# Patient Record
Sex: Female | Born: 1950 | Race: White | Hispanic: No | Marital: Married | Smoking: Never smoker
Health system: Southern US, Community
[De-identification: ages and names within clinical notes are randomized; demographics above are authoritative.]

## PROBLEM LIST (undated history)

## (undated) DIAGNOSIS — R002 Palpitations: Secondary | ICD-10-CM

## (undated) HISTORY — PX: TUBAL LIGATION: SHX77

## (undated) HISTORY — DX: Palpitations: R00.2

---

## 2003-08-28 ENCOUNTER — Emergency Department (HOSPITAL_COMMUNITY): Admission: EM | Admit: 2003-08-28 | Discharge: 2003-08-28 | Payer: Self-pay | Admitting: Family Medicine

## 2008-08-24 DEATH — deceased

## 2012-10-07 ENCOUNTER — Telehealth: Payer: Self-pay | Admitting: Pulmonary Disease

## 2012-10-07 NOTE — Telephone Encounter (Signed)
Entered by error

## 2013-12-23 ENCOUNTER — Other Ambulatory Visit: Payer: Self-pay

## 2013-12-23 DIAGNOSIS — Z1231 Encounter for screening mammogram for malignant neoplasm of breast: Secondary | ICD-10-CM

## 2014-01-14 ENCOUNTER — Ambulatory Visit: Payer: Self-pay

## 2014-01-15 ENCOUNTER — Ambulatory Visit
Admission: RE | Admit: 2014-01-15 | Discharge: 2014-01-15 | Disposition: A | Discharge status: Home / Self Care | Payer: No Typology Code available for payment source | Source: Ambulatory Visit

## 2014-01-15 DIAGNOSIS — Z1231 Encounter for screening mammogram for malignant neoplasm of breast: Secondary | ICD-10-CM

## 2014-01-21 ENCOUNTER — Other Ambulatory Visit: Payer: Self-pay | Admitting: *Deleted

## 2014-01-21 DIAGNOSIS — R928 Other abnormal and inconclusive findings on diagnostic imaging of breast: Secondary | ICD-10-CM

## 2014-02-04 ENCOUNTER — Ambulatory Visit
Admission: RE | Admit: 2014-02-04 | Discharge: 2014-02-04 | Disposition: A | Discharge status: Home / Self Care | Payer: No Typology Code available for payment source | Source: Ambulatory Visit | Attending: *Deleted | Admitting: *Deleted

## 2014-02-04 DIAGNOSIS — R928 Other abnormal and inconclusive findings on diagnostic imaging of breast: Secondary | ICD-10-CM

## 2014-02-11 ENCOUNTER — Other Ambulatory Visit: Payer: No Typology Code available for payment source

## 2015-03-30 ENCOUNTER — Ambulatory Visit: Payer: Self-pay | Admitting: Surgery

## 2015-03-30 NOTE — H&P (Signed)
History of Present Illness Wilmon Arms(Aujanae Mccullum K. Dacari Beckstrand MD; 03/30/2015 10:17 AM) Patient words: cyst back.  The patient is a 65 year old female who presents with a complaint of Mass. This is a 65 yo female who presents with a history of a previous sebaceous cyst on her back that was drained by a family practice physician a couple of years ago. The wound healed and the cyst has recurred. Recently became larger and more inflamed. She has been on a course of doxycycline and this seems to be improving. It still is fairly tender. She would like to have it removed.   Past Surgical History Gilmer Mor(Sonya Bynum, CMA; 03/30/2015 9:48 AM) Cesarean Section - 1  Diagnostic Studies History Gilmer Mor(Sonya Bynum, CMA; 03/30/2015 9:48 AM) Colonoscopy within last year Mammogram 1-3 years ago Pap Smear 1-5 years ago  Allergies Gilmer Mor(Sonya Bynum, CMA; 03/30/2015 9:49 AM) No Known Drug Allergies03/06/2015  Medication History (Sonya Bynum, CMA; 03/30/2015 9:49 AM) Doxycycline Hyclate (100MG  Tablet, Oral) Active. Medications Reconciled  Social History Gilmer Mor(Sonya Bynum, CMA; 03/30/2015 9:48 AM) Alcohol use Occasional alcohol use. Caffeine use Coffee, Tea. No drug use Tobacco use Never smoker.  Family History Gilmer Mor(Sonya Bynum, CMA; 03/30/2015 9:48 AM) Heart Disease Father. Hypertension Mother. Melanoma Mother. Prostate Cancer Father. Respiratory Condition Mother.  Pregnancy / Birth History Gilmer Mor(Sonya Bynum, CMA; 03/30/2015 9:48 AM) Age at menarche 15 years. Age of menopause 646-50 Gravida 5 Maternal age 63-25 Para 5    Review of Systems Lamar Laundry(Sonya Bynum CMA; 03/30/2015 9:48 AM) General Not Present- Appetite Loss, Chills, Fatigue, Fever, Night Sweats, Weight Gain and Weight Loss. Skin Not Present- Change in Wart/Mole, Dryness, Hives, Jaundice, New Lesions, Non-Healing Wounds, Rash and Ulcer. HEENT Not Present- Earache, Hearing Loss, Hoarseness, Nose Bleed, Oral Ulcers, Ringing in the Ears, Seasonal Allergies, Sinus Pain, Sore Throat,  Visual Disturbances, Wears glasses/contact lenses and Yellow Eyes. Respiratory Not Present- Bloody sputum, Chronic Cough, Difficulty Breathing, Snoring and Wheezing. Breast Not Present- Breast Mass, Breast Pain, Nipple Discharge and Skin Changes. Cardiovascular Not Present- Chest Pain, Difficulty Breathing Lying Down, Leg Cramps, Palpitations, Rapid Heart Rate, Shortness of Breath and Swelling of Extremities. Gastrointestinal Not Present- Abdominal Pain, Bloating, Bloody Stool, Change in Bowel Habits, Chronic diarrhea, Constipation, Difficulty Swallowing, Excessive gas, Gets full quickly at meals, Hemorrhoids, Indigestion, Nausea, Rectal Pain and Vomiting. Female Genitourinary Not Present- Frequency, Nocturia, Painful Urination, Pelvic Pain and Urgency. Musculoskeletal Not Present- Back Pain, Joint Pain, Joint Stiffness, Muscle Pain, Muscle Weakness and Swelling of Extremities. Neurological Not Present- Decreased Memory, Fainting, Headaches, Numbness, Seizures, Tingling, Tremor, Trouble walking and Weakness. Psychiatric Not Present- Anxiety, Bipolar, Change in Sleep Pattern, Depression, Fearful and Frequent crying. Endocrine Not Present- Cold Intolerance, Excessive Hunger, Hair Changes, Heat Intolerance, Hot flashes and New Diabetes. Hematology Not Present- Easy Bruising, Excessive bleeding, Gland problems, HIV and Persistent Infections.  Vitals (Sonya Bynum CMA; 03/30/2015 9:48 AM) 03/30/2015 9:48 AM Weight: 103 lb Height: 65in Body Surface Area: 1.49 m Body Mass Index: 17.14 kg/m  Temp.: 16F(Temporal)  Pulse: 75 (Regular)  BP: 122/70 (Sitting, Left Arm, Standard)       Physical Exam Molli Hazard(Jahmeek Shirk K. Keali Mccraw MD; 03/30/2015 10:18 AM) The physical exam findings are as follows: Note:WDWN in NAD Back - oval-shaped 3 cm soft subcutaneous mass with some overlying discoloration; no spreading erythema or drainage noted; minimally tender    Assessment & Plan Molli Hazard(Suhaas Agena K. Radonna Bracher MD; 03/30/2015  10:01 AM) RUPTURED SEBACEOUS CYST (L72.0) Impression: Back - 3 cm Current Plans Schedule for Surgery - Excision of sebaceous cyst -  back. The surgical procedure has been discussed with the patient. Potential risks, benefits, alternative treatments, and expected outcomes have been explained. All of the patient's questions at this time have been answered. The likelihood of reaching the patient's treatment goal is good. The patient understand the proposed surgical procedure and wishes to proceed.   Wilmon Arms. Corliss Skains, MD, [facility]  General/ Trauma Surgery  03/30/2015 10:19 AM

## 2016-06-24 DIAGNOSIS — M159 Polyosteoarthritis, unspecified: Secondary | ICD-10-CM | POA: Insufficient documentation

## 2016-08-01 ENCOUNTER — Other Ambulatory Visit: Payer: Self-pay | Admitting: Physician Assistant

## 2016-08-01 DIAGNOSIS — E559 Vitamin D deficiency, unspecified: Secondary | ICD-10-CM

## 2016-08-01 DIAGNOSIS — Z1231 Encounter for screening mammogram for malignant neoplasm of breast: Secondary | ICD-10-CM

## 2016-08-22 ENCOUNTER — Other Ambulatory Visit: Payer: Self-pay | Admitting: Physician Assistant

## 2016-08-22 DIAGNOSIS — M2550 Pain in unspecified joint: Secondary | ICD-10-CM

## 2016-08-23 ENCOUNTER — Ambulatory Visit: Payer: No Typology Code available for payment source

## 2016-08-23 ENCOUNTER — Other Ambulatory Visit: Payer: No Typology Code available for payment source

## 2016-08-23 ENCOUNTER — Encounter (INDEPENDENT_AMBULATORY_CARE_PROVIDER_SITE_OTHER): Payer: Self-pay

## 2016-08-23 ENCOUNTER — Ambulatory Visit
Admission: RE | Admit: 2016-08-23 | Discharge: 2016-08-23 | Disposition: A | Discharge status: Home / Self Care | Payer: BLUE CROSS/BLUE SHIELD | Source: Ambulatory Visit | Attending: Physician Assistant | Admitting: Physician Assistant

## 2016-08-23 DIAGNOSIS — M2550 Pain in unspecified joint: Secondary | ICD-10-CM

## 2017-12-19 ENCOUNTER — Ambulatory Visit: Payer: BLUE CROSS/BLUE SHIELD | Admitting: Certified Nurse Midwife

## 2017-12-19 ENCOUNTER — Other Ambulatory Visit: Payer: Self-pay | Admitting: *Deleted

## 2017-12-19 ENCOUNTER — Other Ambulatory Visit: Payer: Self-pay

## 2017-12-19 ENCOUNTER — Other Ambulatory Visit (HOSPITAL_COMMUNITY)
Admission: RE | Admit: 2017-12-19 | Discharge: 2017-12-19 | Disposition: A | Discharge status: Home / Self Care | Payer: BLUE CROSS/BLUE SHIELD | Source: Ambulatory Visit | Attending: Obstetrics & Gynecology | Admitting: Obstetrics & Gynecology

## 2017-12-19 ENCOUNTER — Encounter: Payer: Self-pay | Admitting: Certified Nurse Midwife

## 2017-12-19 VITALS — BP 130/80 | HR 68 | Resp 16 | Ht 64.5 in | Wt 102.0 lb

## 2017-12-19 DIAGNOSIS — Z8739 Personal history of other diseases of the musculoskeletal system and connective tissue: Secondary | ICD-10-CM

## 2017-12-19 DIAGNOSIS — J0100 Acute maxillary sinusitis, unspecified: Secondary | ICD-10-CM | POA: Insufficient documentation

## 2017-12-19 DIAGNOSIS — R636 Underweight: Secondary | ICD-10-CM

## 2017-12-19 DIAGNOSIS — Z124 Encounter for screening for malignant neoplasm of cervix: Secondary | ICD-10-CM | POA: Insufficient documentation

## 2017-12-19 DIAGNOSIS — Z01411 Encounter for gynecological examination (general) (routine) with abnormal findings: Secondary | ICD-10-CM | POA: Diagnosis not present

## 2017-12-19 DIAGNOSIS — J209 Acute bronchitis, unspecified: Secondary | ICD-10-CM | POA: Insufficient documentation

## 2017-12-19 DIAGNOSIS — E041 Nontoxic single thyroid nodule: Secondary | ICD-10-CM

## 2017-12-19 DIAGNOSIS — N952 Postmenopausal atrophic vaginitis: Secondary | ICD-10-CM

## 2017-12-19 DIAGNOSIS — R509 Fever, unspecified: Secondary | ICD-10-CM | POA: Insufficient documentation

## 2017-12-19 NOTE — Progress Notes (Signed)
67 y.o. G5P0 Married  Caucasian Fe here to establish gyn care and  for annual exam. Menopausal since late 4440's. Denies vaginal bleeding. Does have severe vaginal dryness, has not treated with anything except some OTC progesterone cream in past. Sexually active with no concerns with pain. Patient has osteoarthritis of joints only. She would like to start on HRT if possible for joint pain. She had discussed with her PCP and told this is not a good idea. Here to discuss along with gyn exam. Patient is thin and has decreased in weight in past 2-3 years and she feels this has not helped. She does exercise and takes OTC calcium and multivitamin. Still feels tired.Had recent lab panel drawn with PCP and will review with her aex later this week. She takes Cymbalta for joint pain which does help. No other health issues today.  No LMP recorded (exact date). Patient is postmenopausal.          Sexually active: Yes.    The current method of family planning is tubal ligation.    Exercising: Yes.    bicycle-pullups Smoker:  no  Review of Systems  Constitutional: Negative.   HENT: Negative.   Eyes: Negative.   Respiratory: Negative.   Cardiovascular: Negative.   Gastrointestinal: Negative.   Genitourinary: Negative.   Musculoskeletal:       Arthritis  Skin: Negative.   Neurological: Negative.   Endo/Heme/Allergies: Negative.   Psychiatric/Behavioral: Negative.     Health Maintenance: Pap:  8772yrs ago-neg per patient  History of Abnormal Pap: no MMG:  1572yrs ago Self Breast exams: no Colonoscopy:  2 or 4572yrs ago neg per patient BMD:   2018 osteopenia, TDaP:  Not sure unsure Shingles: had done Pneumonia: not done Hep C and HIV: not done [facility]   reports that she has never smoked. She has never used smokeless tobacco. She reports that she drank alcohol. She reports that she does not use drugs.  History reviewed. No pertinent past medical history.  Past Surgical History:  Procedure  Laterality Date  . CESAREAN SECTION    . TUBAL LIGATION      Current Outpatient Medications  Medication Sig Dispense Refill  . DULoxetine (CYMBALTA) 30 MG capsule     . Multiple Vitamins-Minerals (MULTIVITAMIN PO) Take by mouth.    Marland Kitchen. UNABLE TO FIND CBD oil     No current facility-administered medications for this visit.     History reviewed. No pertinent family history.  ROS:  Pertinent items are noted in HPI.  Otherwise, a comprehensive ROS was negative.  Exam:   BP 130/80   Pulse 68   Resp 16   Ht 5' 4.5" (1.638 m)   Wt 102 lb (46.3 kg)   LMP  (Exact Date)   BMI 17.24 kg/m  Height: 5' 4.5" (163.8 cm) Ht Readings from Last 3 Encounters:  12/19/17 5' 4.5" (1.638 m)    General appearance: alert, cooperative and appears stated age Head: Normocephalic, without obvious abnormality, atraumatic Neck: no adenopathy, supple, symmetrical, trachea midline and thyroid normal to inspection and palpation and nodule noted on right lower lobe, non tender Lungs: clear to auscultation bilaterally Breasts: normal appearance, no masses or tenderness, No nipple retraction or dimpling, No nipple discharge or bleeding, No axillary or supraclavicular adenopathy, small Heart: regular rate and rhythm Abdomen: soft, non-tender; no masses,  no organomegaly Extremities: extremities normal, atraumatic, no cyanosis or edema Skin: Skin color, texture, turgor normal. No rashes or lesions Lymph nodes: Cervical,  supraclavicular, and axillary nodes normal. No abnormal inguinal nodes palpated Neurologic: Grossly normal   Pelvic: External genitalia:  no lesions              Urethra:  normal appearing urethra with no masses, tenderness or lesions              Bartholin's and Skene's: normal                 Vagina: normal appearing vagina with normal color and discharge, no lesions              Cervix: no cervical motion tenderness and no lesions              Pap taken: Yes.   Bimanual Exam:  Uterus:   normal size, contour, position, consistency, mobility, non-tender and anteverted              Adnexa: normal adnexa and no mass, fullness, tenderness               Rectovaginal: Confirms               Anus:  normal sphincter tone, no lesions  Chaperone present: yes  A:  Well Woman with normal exam  Post menopausal  Osteopenia/osteoarthritis of joints with PCP management with Cymbalta and calcium  Atrophic vaginitis progesterone cream working well (OTC)  Weight loss  Thyroid nodule  Mammogram overdue  P:   Reviewed health and wellness pertinent to exam  Discussed risks/benefits of starting HRT at 66 and concerns with increase uterine, breast cancer risk and may not benefit joint issue. Patient does not want to add another concern. Discussed working with PCP for adequate  Nutrition/calcium/vitamin d and possible water exercise to help with flexability. Discussed using calcium with algae base for better absorption and where she may obtain this, needs to read information prior to starting. Continue follow up with PCP as indicated.  Discussed small frequent meals work better than large ones for weight maintenance. She will try.  Discussed thyroid nodule finding and need to evaluate with [place]. Patient will be called with scheduling information.  Given information to schedule mammogram soon.  Pap smear: yes   counseled on breast self exam, mammography screening, feminine hygiene, osteoporosis, adequate intake of calcium and vitamin D, diet and exercise  return annually or prn  An After Visit Summary was printed and given to the patient.

## 2017-12-19 NOTE — Patient Instructions (Signed)

## 2017-12-25 ENCOUNTER — Ambulatory Visit
Admission: RE | Admit: 2017-12-25 | Discharge: 2017-12-25 | Disposition: A | Discharge status: Home / Self Care | Payer: BLUE CROSS/BLUE SHIELD | Source: Ambulatory Visit | Attending: Certified Nurse Midwife | Admitting: Certified Nurse Midwife

## 2017-12-25 DIAGNOSIS — E041 Nontoxic single thyroid nodule: Secondary | ICD-10-CM

## 2017-12-25 LAB — CYTOLOGY - PAP
DIAGNOSIS: NEGATIVE
HPV (WINDOPATH): NOT DETECTED

## 2017-12-27 ENCOUNTER — Other Ambulatory Visit: Payer: Self-pay | Admitting: Certified Nurse Midwife

## 2018-01-03 ENCOUNTER — Other Ambulatory Visit: Payer: Self-pay | Admitting: Certified Nurse Midwife

## 2018-01-03 ENCOUNTER — Ambulatory Visit
Admission: RE | Admit: 2018-01-03 | Discharge: 2018-01-03 | Disposition: A | Discharge status: Home / Self Care | Payer: BLUE CROSS/BLUE SHIELD | Source: Ambulatory Visit | Attending: Certified Nurse Midwife | Admitting: Certified Nurse Midwife

## 2018-01-03 DIAGNOSIS — Z1231 Encounter for screening mammogram for malignant neoplasm of breast: Secondary | ICD-10-CM

## 2019-02-12 ENCOUNTER — Other Ambulatory Visit: Payer: Self-pay

## 2019-02-15 ENCOUNTER — Ambulatory Visit: Payer: Self-pay | Admitting: Certified Nurse Midwife

## 2019-02-28 ENCOUNTER — Telehealth: Payer: Self-pay

## 2019-02-28 NOTE — Telephone Encounter (Signed)
NOTES ON FILE FROM [facility] [phone] SENT REFERRAL TO SCHEDULING 

## 2019-03-11 ENCOUNTER — Other Ambulatory Visit: Payer: Self-pay

## 2019-03-11 ENCOUNTER — Telehealth: Payer: Self-pay | Admitting: *Deleted

## 2019-03-11 ENCOUNTER — Ambulatory Visit: Payer: Medicare HMO | Admitting: Interventional Cardiology

## 2019-03-11 ENCOUNTER — Encounter: Payer: Self-pay | Admitting: Interventional Cardiology

## 2019-03-11 VITALS — BP 112/68 | HR 72 | Ht 64.5 in | Wt 105.1 lb

## 2019-03-11 DIAGNOSIS — Z7189 Other specified counseling: Secondary | ICD-10-CM

## 2019-03-11 DIAGNOSIS — R9431 Abnormal electrocardiogram [ECG] [EKG]: Secondary | ICD-10-CM

## 2019-03-11 DIAGNOSIS — R002 Palpitations: Secondary | ICD-10-CM

## 2019-03-11 NOTE — Telephone Encounter (Signed)
Patient enrolled for [employer] to ship a 30 day cardiac event monitor to her home.  My Chart message sent with instructions.

## 2019-03-11 NOTE — Patient Instructions (Signed)
Medication Instructions:  Your physician recommends that you continue on your current medications as directed. Please refer to the Current Medication list given to you today.  *If you need a refill on your cardiac medications before your next appointment, please call your pharmacy*  Lab Work: None If you have labs (blood work) drawn today and your tests are completely normal, you will receive your results only by: Marland Kitchen MyChart Message (if you have MyChart) OR . A paper copy in the mail If you have any lab test that is abnormal or we need to change your treatment, we will call you to review the results.  Testing/Procedures: Your physician has recommended that you wear an event monitor. Event monitors are medical devices that record the heart's electrical activity. Doctors most often [place] these monitors to diagnose arrhythmias. Arrhythmias are problems with the speed or rhythm of the heartbeat. The monitor is a small, portable device. You can wear one while you do your normal daily activities. This is usually used to diagnose what is causing palpitations/syncope (passing out).   Follow-Up: At [facility], you and your health needs are our priority.  As part of our continuing mission to provide you with exceptional heart care, we have created designated Provider Care Teams.  These Care Teams include your primary Cardiologist (physician) and Advanced Practice Providers (APPs -  Physician Assistants and Nurse Practitioners) who all work together to provide you with the care you need, when you need it.  Your next appointment:   As needed  The format for your next appointment:   In Person  Provider:   You may see Dr. Verdis Prime or one of the following Advanced Practice Providers on your designated Care Team:    Norma Fredrickson, NP  Nada Boozer, NP  Georgie Chard, NP   Other Instructions

## 2019-03-11 NOTE — Progress Notes (Signed)
Cardiology Office Note:    Date:  03/11/2019   ID:  Kristie Salas, DOB 1950/07/08, MRN [id number]  PCP:  Buzzy Han, MD  Cardiologist:  No primary care provider on file.   Referring MD: Buzzy Han*   Chief Complaint  Patient presents with  . Irregular Heart Beat    History of Present Illness:    Kristie Salas is a 69 y.o. female with a hx of palpitations referred for clinical evaluation.  For more than 20 years she has had irregularity in heartbeat.  These palpitations are most notable at night.  This happens when she lies down to try to sleep.  She also has episodes where the heart will race for minutes and cause her to feel tight in her chest and weak.  She has been having increased frequency of these episodes over the past several weeks.  She denies sympathomimetics and alcohol.  She denies significant caffeine intake.  She does not smoke.  Past Medical History:  Diagnosis Date  . Palpitations     Past Surgical History:  Procedure Laterality Date  . CESAREAN SECTION    . TUBAL LIGATION      Current Medications: Current Meds  Medication Sig  . Calcium Carbonate Antacid (CALCIUM CARBONATE PO) Take by mouth.  . DULoxetine (CYMBALTA) 30 MG capsule   . Fluticasone Furoate 50 MCG/ACT AEPB Inhale into the lungs.  . Multiple Vitamins-Minerals (MULTIVITAMIN PO) Take by mouth.  . Turmeric (QC TUMERIC COMPLEX PO) Take by mouth.  Marland Kitchen UNABLE TO FIND CBD oil     Allergies:   Patient has no known allergies.   Social History   Socioeconomic History  . Marital status: Married    Spouse name: Not on file  . Number of children: Not on file  . Years of education: Not on file  . Highest education level: Not on file  Occupational History  . Not on file  Tobacco Use  . Smoking status: Never Smoker  . Smokeless tobacco: Never Used  Substance and Sexual Activity  . Alcohol use: Not Currently  . Drug use: Never  . Sexual activity: Yes    Partners:  Male    Birth control/protection: Surgical    Comment: BTL  Other Topics Concern  . Not on file  Social History Narrative  . Not on file   Social Determinants of Health   Financial Resource Strain:   . Difficulty of Paying Living Expenses: Not on file  Food Insecurity:   . Worried About Charity fundraiser in the Last Year: Not on file  . Ran Out of Food in the Last Year: Not on file  Transportation Needs:   . Lack of Transportation (Medical): Not on file  . Lack of Transportation (Non-Medical): Not on file  Physical Activity:   . Days of Exercise per Week: Not on file  . Minutes of Exercise per Session: Not on file  Stress:   . Feeling of Stress : Not on file  Social Connections:   . Frequency of Communication with Friends and Family: Not on file  . Frequency of Social Gatherings with Friends and Family: Not on file  . Attends Religious Services: Not on file  . Active Member of Clubs or Organizations: Not on file  . Attends Archivist Meetings: Not on file  . Marital Status: Not on file     Family History: The patient's family history includes Hypertension in her father and mother; Other in her father;  Prostate cancer in her father.  ROS:   Please see the history of present illness.    No prior history of heart disease.  Prior cardiac work-up.  A work-up was done because of possible heart attack at prior remote encounter but no abnormalities were found.  All other systems reviewed and are negative.  EKGs/Labs/Other Studies Reviewed:    The following studies were reviewed today: No cardiac data  EKG:  EKG sinus rhythm with nonspecific T wave flattening and ST depression II, III, and aVF.  Recent Labs: No results found for requested labs within last 8760 hours.  Recent Lipid Panel No results found for: CHOL, TRIG, HDL, CHOLHDL, VLDL, LDLCALC, LDLDIRECT  Physical Exam:    VS:  BP 112/68   Pulse 72   Ht 5' 4.5" (1.638 m)   Wt 105 lb 1.9 oz (47.7 kg)    BMI 17.77 kg/m     Wt Readings from Last 3 Encounters:  03/11/19 105 lb 1.9 oz (47.7 kg)  12/19/17 102 lb (46.3 kg)     GEN: Slender. No acute distress HEENT: Normal NECK: No JVD. LYMPHATICS: No lymphadenopathy CARDIAC:  RRR without murmur, gallop, or edema. VASCULAR:  Normal Pulses. No bruits. RESPIRATORY:  Clear to auscultation without rales, wheezing or rhonchi  ABDOMEN: Soft, non-tender, non-distended, No pulsatile mass, MUSCULOSKELETAL: No deformity  SKIN: Warm and dry NEUROLOGIC:  Alert and oriented x 3 PSYCHIATRIC:  Normal affect   ASSESSMENT:    1. Palpitations   2. Nonspecific abnormal electrocardiogram (ECG) (EKG)   3. Educated about COVID-19 virus infection    PLAN:    In order of problems listed above:  1. Rule out atrial fibrillation.  Rule out other.  We will plan a 30-day continuous monitor to rule out malignant arrhythmias, atrial fibrillation, versus more benign findings. 2. Abnormal EKG with nonspecific inferior ST depression.  If PVCs are noted on monitor, we will need to do an ischemic evaluation. 3. COVID-19 preventive measures including 3W's and COVID-19 vaccine.   Medication Adjustments/Labs and Tests Ordered: Current medicines are reviewed at length with the patient today.  Concerns regarding medicines are outlined above.  Orders Placed This Encounter  Procedures  . Cardiac event monitor  . EKG 12-Lead   No orders of the defined types were placed in this encounter.   Patient Instructions  Medication Instructions:  Your physician recommends that you continue on your current medications as directed. Please refer to the Current Medication list given to you today.  *If you need a refill on your cardiac medications before your next appointment, please call your pharmacy*  Lab Work: None If you have labs (blood work) drawn today and your tests are completely normal, you will receive your results only by: Marland Kitchen MyChart Message (if you have MyChart)  OR . A paper copy in the mail If you have any lab test that is abnormal or we need to change your treatment, we will call you to review the results.  Testing/Procedures: Your physician has recommended that you wear an event monitor. Event monitors are medical devices that record the heart's electrical activity. Doctors most often [place] these monitors to diagnose arrhythmias. Arrhythmias are problems with the speed or rhythm of the heartbeat. The monitor is a small, portable device. You can wear one while you do your normal daily activities. This is usually used to diagnose what is causing palpitations/syncope (passing out).   Follow-Up: At [facility], you and your health needs are our priority.  As  part of our continuing mission to provide you with exceptional heart care, we have created designated Provider Care Teams.  These Care Teams include your primary Cardiologist (physician) and Advanced Practice Providers (APPs -  Physician Assistants and Nurse Practitioners) who all work together to provide you with the care you need, when you need it.  Your next appointment:   As needed  The format for your next appointment:   In Person  Provider:   You may see Dr. Verdis Prime or one of the following Advanced Practice Providers on your designated Care Team:    Norma Fredrickson, NP  Nada Boozer, NP  Georgie Chard, NP   Other Instructions      Signed, Lesleigh Noe, MD  03/11/2019 3:01 PM    [facility]

## 2019-03-21 ENCOUNTER — Encounter: Payer: Self-pay | Admitting: Interventional Cardiology

## 2019-03-21 ENCOUNTER — Ambulatory Visit (INDEPENDENT_AMBULATORY_CARE_PROVIDER_SITE_OTHER): Payer: Medicare HMO

## 2019-03-21 DIAGNOSIS — R002 Palpitations: Secondary | ICD-10-CM

## 2019-04-01 ENCOUNTER — Telehealth: Payer: Self-pay | Admitting: *Deleted

## 2019-04-01 NOTE — Telephone Encounter (Signed)
Received a faxed monitor report dated 3/5 at 7:35 pm demonstrating Sinus brady - Sinus Rhythm w/PACs.  Called and spoke with pt regarding this information.  She reports she didn't feel any different then than when she normal has skipping beats.  She does report it seems as though the skipping beats has been occurring more frequently since November.  She will continue to wear the cardiac monitor as ordered.

## 2019-04-02 ENCOUNTER — Telehealth: Payer: Self-pay

## 2019-04-02 NOTE — Telephone Encounter (Signed)
I spoke to the patient after receiving a Cardiac Report from [employer] from 04/01/19 @ 11:34 pm indicating Sinus Rhythm w/ Atrial run Rate 130 bpm.    I called the patient who informed me that she went to bed at approximately 11:15 pm and was drifting to sleep and reports feeling nothing different than she normally does with "skipping beats."  I told her that I would reach out to Dr Katrinka Blazing and call her back, if further advisement.

## 2019-04-02 NOTE — Telephone Encounter (Signed)
Lpm regarding monitor report from 3/8.

## 2019-04-02 NOTE — Telephone Encounter (Signed)
I reviewed [employer] monitor with Dr Katrinka Blazing who advised to continue to monitor.

## 2019-04-02 NOTE — Telephone Encounter (Signed)
Patient called back returning a call from Casimiro Needle. Please return her call.

## 2019-04-16 ENCOUNTER — Encounter: Payer: Self-pay | Admitting: Certified Nurse Midwife

## 2019-05-03 ENCOUNTER — Other Ambulatory Visit: Payer: Self-pay | Admitting: Interventional Cardiology

## 2019-05-03 DIAGNOSIS — R002 Palpitations: Secondary | ICD-10-CM

## 2019-05-31 ENCOUNTER — Telehealth: Payer: Self-pay | Admitting: Nurse Practitioner

## 2019-05-31 MED ORDER — METOPROLOL SUCCINATE ER 25 MG PO TB24: 25.0000 mg | ORAL_TABLET | Freq: Every day | ORAL | 3 refills | Status: DC

## 2019-05-31 NOTE — Telephone Encounter (Signed)
Reviewed monitor results and plan of care with patient. She verbalized understanding and agreement to start metoprolol succinate 25 mg daily and requests that Rx be sent to [facility]. She agrees to return in approximately 1 month for follow-up and is scheduled to see Norma Fredrickson, NP on 6/2. I advised her to call back prior to her appointment with questions or concerns and she thanked me for the call.  Monitor results forwarded to PCP per request of Dr. Katrinka Blazing.

## 2019-05-31 NOTE — Telephone Encounter (Signed)
-----   Message from Lyn Records, MD sent at 05/19/2019  3:30 PM EDT ----- Let the patient know there are frequent PAC's and PVC's that correlate with complaints. Start Toprol XL 25 mg daily. May need 50 mg daily. Needs OV after 3-4 weeks on 25 mg to determine if titration reasonable/needed. A copy will be sent to Margot Ables, MD

## 2019-06-19 NOTE — Progress Notes (Signed)
CARDIOLOGY OFFICE NOTE  Date:  06/26/2019    Kristie Salas Date of Birth: 1950/05/08 Medical Record #[id number]  PCP:  Margot Ables, MD  Cardiologist:  Katrinka Blazing   Chief Complaint  Patient presents with  . Follow-up    Seen for Dr. Katrinka Blazing    History of Present Illness: Kristie Salas is a 69 y.o. female who presents today for a follow up visit. Seen for Dr. Katrinka Blazing.   She has a history of palpitations. Was referred to Dr. Katrinka Blazing for evaluation back in February - monitor was placed. Low dose beta blocker was started after the monitor results showing symptomatic PACs, PVCs and NSVT. Dr. Katrinka Blazing had recommended an ischemic work up if PVCs were noted - she had had some non specific ST changes on her EKG.   The patient does not have symptoms concerning for COVID-19 infection (fever, chills, cough, or new shortness of breath).   Comes in today. Here alone. She only took the Metoprolol for a week - did not feel like she needed it. Her palpitations have resolved. But she is concerned this may recur and would like to stay in touch with [place]. She just does not wish to take this long term.  There has been considerable stress noted with her work. No chest pain. She says her labs are checked thru her PCP and that she had a physical in the past year with lipids and thyroid levels - she does not know what those numbers are. She has never smoked.    Past Medical History:  Diagnosis Date  . Palpitations     Past Surgical History:  Procedure Laterality Date  . CESAREAN SECTION    . TUBAL LIGATION       Medications: Current Meds  Medication Sig  . Calcium Carbonate Antacid (CALCIUM CARBONATE PO) Take by mouth.  . DULoxetine (CYMBALTA) 30 MG capsule   . Fluticasone Furoate 50 MCG/ACT AEPB Inhale into the lungs.  . Multiple Vitamins-Minerals (MULTIVITAMIN PO) Take by mouth.  Marland Kitchen UNABLE TO FIND CBD oil     Allergies: No Known Allergies  Social History: The patient  reports that  she has never smoked. She has never used smokeless tobacco. She reports previous alcohol use. She reports that she does not use drugs.   Family History: The patient's family history includes Hypertension in her father and mother; Other in her father; Prostate cancer in her father. Father with PPM.   Review of Systems: Please see the history of present illness.   All other systems are reviewed and negative.   Physical Exam: VS:  BP 120/80   Pulse 60   Ht 5' 4.5" (1.638 m)   Wt 104 lb 6.4 oz (47.4 kg)   SpO2 97%   BMI 17.64 kg/m  .  BMI Body mass index is 17.64 kg/m.  Wt Readings from Last 3 Encounters:  06/26/19 104 lb 6.4 oz (47.4 kg)  03/11/19 105 lb 1.9 oz (47.7 kg)  12/19/17 102 lb (46.3 kg)    General: Alert. She is quite thin. Alert and in no acute distress.   Cardiac: Regular rate and rhythm. No murmurs, rubs, or gallops. No edema.  Respiratory:  Lungs are clear to auscultation bilaterally with normal work of breathing.  GI: Soft and nontender.  MS: No deformity or atrophy. Gait and ROM intact.  Skin: Warm and dry. Color is normal.  Neuro:  Strength and sensation are intact and no gross focal deficits noted.  Psych:  Alert, appropriate and with normal affect.   LABORATORY DATA:  EKG:  EKG is not ordered today.    No results found for: WBC, HGB, HCT, PLT, GLUCOSE, CHOL, TRIG, HDL, LDLDIRECT, LDLCALC, ALT, AST, NA, K, CL, CREATININE, BUN, CO2, TSH, PSA, INR, GLUF, HGBA1C, MICROALBUR   BNP (last 3 results) No results for input(s): BNP in the last 8760 hours.  ProBNP (last 3 results) No results for input(s): PROBNP in the last 8760 hours.   Other Studies Reviewed Today:  Monitor Study Highlights 04/2019    NSR, sinus bradycardia and rare sinus tachycardia.  PAC's and PVC's are noted  Non sustained SVT, up to 185 BPM  Flutter and heart racing correlated with the above findings temporally with moderate to high specificity  Chest pain and shortness of  breath do not correlate with arrhythmia.  No atrial fibrillation is noted.   Symptomatic PAC's PVC's and non sustained SVT.   Belva Crome, MD  05/19/2019 3:30 PM EDT    Let the patient know there are frequent PAC's and PVC's that correlate with complaints. Start Toprol XL 25 mg daily. May need 50 mg daily. Needs OV after 3-4 weeks on 25 mg to determine if titration reasonable/needed. A copy will be sent to Buzzy Han, MD      ASSESSMENT & PLAN:    1. Symptomatic palpitations - monitor has shown PACs, PVCs and NSVT - Dr. Tamala Julian had wanted ischemic work up if PVCs were noted - she is not interested in chemical stress testing due to prior adverse experience - resting EKG was abnormal - will arrange for stress Myoview - she will need COVID testing and she is agreeable. Will use the beta blocker prn - would change to Inderal but will see how her testing turns out. Tentatively see back in a year, sooner if her stress testing is abnormal.   2. Non specific abnormal EKG - see above.   3. COVID-19 Education: The signs and symptoms of COVID-19 were discussed with the patient and how to seek care for testing (follow up with PCP or arrange E-visit).  The importance of social distancing, staying at home, hand hygiene and wearing a mask when out in public were discussed today. She has been vaccinated.   Current medicines are reviewed with the patient today.  The patient does not have concerns regarding medicines other than what has been noted above.  The following changes have been made:  See above.  Labs/ tests ordered today include:    Orders Placed This Encounter  Procedures  . MYOCARDIAL PERFUSION IMAGING     Disposition:   FU with [place] in one year unless stress testing is abnormal. Beta blocker can be prn for now.    Patient is agreeable to this plan and will call if any problems develop in the interim.   SignedTruitt Merle, NP  06/26/2019 9:03 AM  [facility] [address] [place], [place]  [address] Phone: [phone] Fax: [phone]

## 2019-06-26 ENCOUNTER — Encounter: Payer: Self-pay | Admitting: Nurse Practitioner

## 2019-06-26 ENCOUNTER — Ambulatory Visit: Payer: Medicare HMO | Admitting: Nurse Practitioner

## 2019-06-26 ENCOUNTER — Other Ambulatory Visit: Payer: Self-pay

## 2019-06-26 VITALS — BP 120/80 | HR 60 | Ht 64.5 in | Wt 104.4 lb

## 2019-06-26 DIAGNOSIS — R9431 Abnormal electrocardiogram [ECG] [EKG]: Secondary | ICD-10-CM | POA: Diagnosis not present

## 2019-06-26 DIAGNOSIS — Z7189 Other specified counseling: Secondary | ICD-10-CM | POA: Diagnosis not present

## 2019-06-26 DIAGNOSIS — R002 Palpitations: Secondary | ICD-10-CM

## 2019-06-26 MED ORDER — METOPROLOL SUCCINATE ER 25 MG PO TB24: 25.0000 mg | ORAL_TABLET | Freq: Every day | ORAL | Status: DC | PRN

## 2019-06-26 NOTE — Patient Instructions (Addendum)
After Visit Summary:  We will be checking the following labs today - NONE   Medication Instructions:    Continue with your current medicines.   Ok to use the Toprol as needed   If you need a refill on your cardiac medications before your next appointment, please call your pharmacy.     Testing/Procedures To Be Arranged:  Stress Myoview  You are scheduled for a Myocardial Perfusion Imaging Study on _____________________________ at ________________________________.   Please arrive 15 minutes prior to your appointment time for registration and insurance purposes.   The test will take approximately 3 to 4 hours to complete; you may bring reading material. If someone comes with you to your appointment, they will need to remain in the main lobby due to limited space in the testing area.   How to prepare for your Myocardial Perfusion test:   Do not eat or drink 3 hours prior to your test, except you may have water.    Do not consume products containing caffeine (regular or decaffeinated) 12 hours prior to your test (ex: coffee, chocolate, soda, tea)   Do bring a list of your current medications with you. If not listed below, you may take your medications as normal.    Do not take Toprol for 24 hours prior to the test.   Bring any held medication to your appointment, as you may be required to take it once the test is complete.   Do wear comfortable clothes (no dresses or overalls) and walking shoes. Tennis shoes are preferred. No heels or open toed shoes.  Do not wear cologne, perfume, aftershave or lotions (deodorant is allowed).   If these instructions are not followed, you test will have to be rescheduled.   Please report to [address] for your test. If you have questions or concerns about your appointment, please call the Nuclear Lab at #[phone].  If you cannot keep your appointment, please provide 24 hour notification to the Nuclear lab to  avoid a possible $50 charge to your account.       Follow-Up:   See Dr. Katrinka Blazing in one year - You will receive a reminder letter in the mail two months in advance. If you don't receive a letter, please call our office to schedule the follow-up appointment.     At [facility], you and your health needs are our priority.  As part of our continuing mission to provide you with exceptional heart care, we have created designated Provider Care Teams.  These Care Teams include your primary Cardiologist (physician) and Advanced Practice Providers (APPs -  Physician Assistants and Nurse Practitioners) who all work together to provide you with the care you need, when you need it.  Special Instructions:  . Stay safe, stay home, wash your hands for at least 20 seconds and wear a mask when out in public.  . It was good to talk with you today.    Call the [facility] office at [phone] if you have any questions, problems or concerns.

## 2019-07-15 ENCOUNTER — Encounter (HOSPITAL_COMMUNITY): Payer: Medicare HMO

## 2019-07-18 ENCOUNTER — Encounter (HOSPITAL_COMMUNITY): Payer: Medicare HMO

## 2019-12-31 ENCOUNTER — Other Ambulatory Visit: Payer: Self-pay | Admitting: Family Medicine

## 2019-12-31 DIAGNOSIS — Z1231 Encounter for screening mammogram for malignant neoplasm of breast: Secondary | ICD-10-CM

## 2020-05-14 ENCOUNTER — Other Ambulatory Visit: Payer: Self-pay | Admitting: Family Medicine

## 2020-05-14 DIAGNOSIS — Z13820 Encounter for screening for osteoporosis: Secondary | ICD-10-CM

## 2020-05-14 DIAGNOSIS — M8580 Other specified disorders of bone density and structure, unspecified site: Secondary | ICD-10-CM

## 2020-05-18 ENCOUNTER — Other Ambulatory Visit: Payer: Self-pay | Admitting: Interventional Cardiology

## 2020-07-03 ENCOUNTER — Ambulatory Visit
Admission: RE | Admit: 2020-07-03 | Discharge: 2020-07-03 | Disposition: A | Discharge status: Home / Self Care | Payer: Medicare HMO | Source: Ambulatory Visit | Attending: Family Medicine | Admitting: Family Medicine

## 2020-07-03 ENCOUNTER — Other Ambulatory Visit: Payer: Self-pay

## 2020-07-03 DIAGNOSIS — Z1231 Encounter for screening mammogram for malignant neoplasm of breast: Secondary | ICD-10-CM

## 2020-08-21 ENCOUNTER — Other Ambulatory Visit: Payer: Self-pay | Admitting: Family Medicine

## 2020-08-21 ENCOUNTER — Other Ambulatory Visit: Payer: Self-pay

## 2020-08-21 ENCOUNTER — Ambulatory Visit
Admission: RE | Admit: 2020-08-21 | Discharge: 2020-08-21 | Disposition: A | Discharge status: Home / Self Care | Payer: Medicare HMO | Source: Ambulatory Visit | Attending: Family Medicine | Admitting: Family Medicine

## 2020-08-21 DIAGNOSIS — W19XXXA Unspecified fall, initial encounter: Secondary | ICD-10-CM

## 2020-10-29 ENCOUNTER — Other Ambulatory Visit: Payer: Medicare HMO

## 2021-01-06 ENCOUNTER — Other Ambulatory Visit: Payer: Self-pay | Admitting: Family Medicine

## 2021-01-06 DIAGNOSIS — M25512 Pain in left shoulder: Secondary | ICD-10-CM

## 2021-04-07 ENCOUNTER — Ambulatory Visit
Admission: RE | Admit: 2021-04-07 | Discharge: 2021-04-07 | Disposition: A | Discharge status: Home / Self Care | Payer: Medicare HMO | Source: Ambulatory Visit | Attending: Family Medicine | Admitting: Family Medicine

## 2021-04-07 DIAGNOSIS — Z13820 Encounter for screening for osteoporosis: Secondary | ICD-10-CM

## 2021-08-16 ENCOUNTER — Other Ambulatory Visit: Payer: Self-pay | Admitting: Internal Medicine

## 2021-08-16 ENCOUNTER — Other Ambulatory Visit: Payer: Self-pay | Admitting: Family Medicine

## 2021-08-16 DIAGNOSIS — Z1231 Encounter for screening mammogram for malignant neoplasm of breast: Secondary | ICD-10-CM

## 2021-09-03 ENCOUNTER — Ambulatory Visit: Payer: Medicare HMO

## 2021-09-09 ENCOUNTER — Ambulatory Visit
Admission: RE | Admit: 2021-09-09 | Discharge: 2021-09-09 | Disposition: A | Discharge status: Home / Self Care | Payer: Medicare HMO | Source: Ambulatory Visit | Attending: Family Medicine | Admitting: Family Medicine

## 2021-09-09 DIAGNOSIS — Z1231 Encounter for screening mammogram for malignant neoplasm of breast: Secondary | ICD-10-CM

## 2022-01-05 ENCOUNTER — Emergency Department (HOSPITAL_COMMUNITY)
Admission: EM | Admit: 2022-01-05 | Discharge: 2022-01-06 | Disposition: A | Discharge status: Home / Self Care | Payer: Medicare HMO | Attending: Emergency Medicine | Admitting: Emergency Medicine

## 2022-01-05 ENCOUNTER — Other Ambulatory Visit: Payer: Self-pay

## 2022-01-05 ENCOUNTER — Emergency Department (HOSPITAL_COMMUNITY): Payer: Medicare HMO

## 2022-01-05 DIAGNOSIS — R42 Dizziness and giddiness: Secondary | ICD-10-CM | POA: Diagnosis not present

## 2022-01-05 DIAGNOSIS — Z1152 Encounter for screening for COVID-19: Secondary | ICD-10-CM | POA: Diagnosis not present

## 2022-01-05 DIAGNOSIS — R0602 Shortness of breath: Secondary | ICD-10-CM | POA: Diagnosis not present

## 2022-01-05 LAB — CBC WITH DIFFERENTIAL/PLATELET
Abs Immature Granulocytes: 0.02 10*3/uL (ref 0.00–0.07)
Basophils Absolute: 0 10*3/uL (ref 0.0–0.1)
Basophils Relative: 0 %
Eosinophils Absolute: 0 10*3/uL (ref 0.0–0.5)
Eosinophils Relative: 0 %
HCT: 38.1 % (ref 36.0–46.0)
Hemoglobin: 12.3 g/dL (ref 12.0–15.0)
Immature Granulocytes: 0 %
Lymphocytes Relative: 7 %
Lymphs Abs: 0.5 10*3/uL — ABNORMAL LOW (ref 0.7–4.0)
MCH: 28.7 pg (ref 26.0–34.0)
MCHC: 32.3 g/dL (ref 30.0–36.0)
MCV: 88.8 fL (ref 80.0–100.0)
Monocytes Absolute: 0.2 10*3/uL (ref 0.1–1.0)
Monocytes Relative: 3 %
Neutro Abs: 6 10*3/uL (ref 1.7–7.7)
Neutrophils Relative %: 90 %
Platelets: 144 10*3/uL — ABNORMAL LOW (ref 150–400)
RBC: 4.29 MIL/uL (ref 3.87–5.11)
RDW: 12.1 % (ref 11.5–15.5)
WBC: 6.6 10*3/uL (ref 4.0–10.5)
nRBC: 0 % (ref 0.0–0.2)

## 2022-01-05 MED ORDER — MECLIZINE HCL 25 MG PO TABS
25.0000 mg | ORAL_TABLET | Freq: Once | ORAL | Status: AC
  Administered 2022-01-06: 25 mg via ORAL
  Filled 2022-01-05: qty 1

## 2022-01-05 NOTE — ED Notes (Signed)
Patient taken to CT at this time.

## 2022-01-05 NOTE — ED Triage Notes (Signed)
Patient BIB EMS for evaluation of dizziness and vomiting. Reports she was washing dishes this morning and turned her head.  Began to have dizziness and vomiting.  States she has lied in bed all day and symptoms have not improvement.  Any movement causes nausea and vomiting.  Given 800 mL NS with slight improvement in symptoms

## 2022-01-06 LAB — BASIC METABOLIC PANEL
Anion gap: 10 (ref 5–15)
BUN: 14 mg/dL (ref 8–23)
CO2: 18 mmol/L — ABNORMAL LOW (ref 22–32)
Calcium: 8.3 mg/dL — ABNORMAL LOW (ref 8.9–10.3)
Chloride: 113 mmol/L — ABNORMAL HIGH (ref 98–111)
Creatinine, Ser: 0.52 mg/dL (ref 0.44–1.00)
GFR, Estimated: >60 mL/min (ref >60)
Glucose, Bld: 111 mg/dL — ABNORMAL HIGH (ref 70–99)
Potassium: 3.3 mmol/L — ABNORMAL LOW (ref 3.5–5.1)
Sodium: 141 mmol/L (ref 135–145)

## 2022-01-06 LAB — URINALYSIS, ROUTINE W REFLEX MICROSCOPIC
Bacteria, UA: NONE SEEN
Bilirubin Urine: NEGATIVE
Glucose, UA: NEGATIVE mg/dL
Hgb urine dipstick: NEGATIVE
Ketones, ur: 80 mg/dL — AB
Leukocytes,Ua: NEGATIVE
Nitrite: NEGATIVE
Protein, ur: 30 mg/dL — AB
Specific Gravity, Urine: 1.017 (ref 1.005–1.030)
pH: 7 (ref 5.0–8.0)

## 2022-01-06 LAB — TROPONIN I (HIGH SENSITIVITY): Troponin I (High Sensitivity): 3 ng/L (ref <18)

## 2022-01-06 LAB — RESP PANEL BY RT-PCR (RSV, FLU A&B, COVID)  RVPGX2
Influenza A by PCR: NEGATIVE
Influenza B by PCR: NEGATIVE
Resp Syncytial Virus by PCR: NEGATIVE
SARS Coronavirus 2 by RT PCR: NEGATIVE

## 2022-01-06 MED ORDER — MECLIZINE HCL 25 MG PO TABS: 25.0000 mg | ORAL_TABLET | Freq: Three times a day (TID) | ORAL | 0 refills | Status: AC

## 2022-01-06 MED ORDER — SODIUM CHLORIDE 0.9 % IV BOLUS
500.0000 mL | Freq: Once | INTRAVENOUS | Status: AC
  Administered 2022-01-06: 500 mL via INTRAVENOUS

## 2022-01-06 NOTE — ED Provider Notes (Signed)
[facility]-EMERGENCY DEPT Provider Note   CSN: [id number] Arrival date & time: 01/05/22  2244     History  Chief Complaint  Patient presents with   Dizziness   Emesis    Kristie Salas is a 71 y.o. female.  The history is provided by the patient.  Dizziness Quality:  Head spinning Severity:  Severe Onset quality:  Sudden Duration:  18 hours Timing:  Constant Progression since onset: minimal if head is not moving. Chronicity:  New Context: head movement   Relieved by:  Nothing Worsened by:  Nothing Ineffective treatments:  None tried Associated symptoms: nausea and vomiting   Associated symptoms: no blood in stool, no chest pain, no headaches, no hearing loss, no shortness of breath, no tinnitus, no vision changes and no weakness   Risk factors: no hx of vertigo and no Meniere's disease   Emesis Associated symptoms: no fever and no headaches   Patient with palpitations who presents with vertigo with head movement.    Past Medical History:  Diagnosis Date   Palpitations        Home Medications Prior to Admission medications   Medication Sig Start Date End Date Taking? Authorizing Provider  meclizine (ANTIVERT) 25 MG tablet Take 1 tablet (25 mg total) by mouth 3 (three) times daily. 01/06/22  Yes Jaleena Viviani, MD  Calcium Carbonate Antacid (CALCIUM CARBONATE PO) Take by mouth.    [provider]  DULoxetine (CYMBALTA) 30 MG capsule     [provider]  Fluticasone Furoate 50 MCG/ACT AEPB Inhale into the lungs.    [provider]  metoprolol succinate (TOPROL-XL) 25 MG 24 hr tablet TAKE 1 TABLET EVERY DAY 05/18/20   Lyn Records, MD  Multiple Vitamins-Minerals (MULTIVITAMIN PO) Take by mouth.    [provider]  UNABLE TO FIND CBD oil    [provider]      Allergies    Patient has no known allergies.    Review of Systems   Review of Systems  Constitutional:  Negative for fever.  HENT:   Negative for hearing loss and tinnitus.   Eyes:  Negative for redness.  Respiratory:  Negative for shortness of breath.   Cardiovascular:  Negative for chest pain.  Gastrointestinal:  Positive for nausea and vomiting. Negative for blood in stool.  Neurological:  Positive for dizziness. Negative for weakness and headaches.  All other systems reviewed and are negative.   Physical Exam Updated Vital Signs BP (!) 142/81   Pulse 73   Temp 98.4 F (36.9 C)   Resp 18   Ht 5\' 5"  (1.651 m)   Wt 47.6 kg   SpO2 96%   BMI 17.47 kg/m  Physical Exam Vitals and nursing note reviewed.  Constitutional:      General: She is not in acute distress.    Appearance: Normal appearance. She is well-developed.  HENT:     Head: Normocephalic and atraumatic.     Right Ear: Tympanic membrane normal.     Left Ear: Tympanic membrane normal.     Nose: Nose normal.  Eyes:     Extraocular Movements: Extraocular movements intact.     Conjunctiva/sclera: Conjunctivae normal.     Pupils: Pupils are equal, round, and reactive to light.  Cardiovascular:     Rate and Rhythm: Normal rate and regular rhythm.     Pulses: Normal pulses.     Heart sounds: Normal heart sounds.  Pulmonary:     Effort:  No respiratory distress.     Breath sounds: Normal breath sounds.  Abdominal:     General: Bowel sounds are normal. There is no distension.     Palpations: Abdomen is soft.     Tenderness: There is no abdominal tenderness. There is no guarding or rebound.  Genitourinary:    Vagina: No vaginal discharge.  Musculoskeletal:        General: Normal range of motion.     Cervical back: Normal range of motion and neck supple.  Skin:    General: Skin is warm and dry.     Capillary Refill: Capillary refill takes less than 2 seconds.     Findings: No erythema or rash.  Neurological:     General: No focal deficit present.     Mental Status: She is alert and oriented to person, place, and time.     Cranial Nerves: No  cranial nerve deficit.  Psychiatric:        Mood and Affect: Mood normal.     ED Results / Procedures / Treatments   Labs (all labs ordered are listed, but only abnormal results are displayed) Results for orders placed or performed during the hospital encounter of 01/05/22  Resp panel by RT-PCR (RSV, Flu A&B, Covid) Anterior Nasal Swab   Specimen: Anterior Nasal Swab  Result Value Ref Range   SARS Coronavirus 2 by RT PCR NEGATIVE NEGATIVE   Influenza A by PCR NEGATIVE NEGATIVE   Influenza B by PCR NEGATIVE NEGATIVE   Resp Syncytial Virus by PCR NEGATIVE NEGATIVE  CBC with Differential  Result Value Ref Range   WBC 6.6 4.0 - 10.5 K/uL   RBC 4.29 3.87 - 5.11 MIL/uL   Hemoglobin 12.3 12.0 - 15.0 g/dL   HCT 44.038.1 10.236.0 - 72.546.0 %   MCV 88.8 80.0 - 100.0 fL   MCH 28.7 26.0 - 34.0 pg   MCHC 32.3 30.0 - 36.0 g/dL   RDW 36.612.1 44.011.5 - 34.715.5 %   Platelets 144 (L) 150 - 400 K/uL   nRBC 0.0 0.0 - 0.2 %   Neutrophils Relative % 90 %   Neutro Abs 6.0 1.7 - 7.7 K/uL   Lymphocytes Relative 7 %   Lymphs Abs 0.5 (L) 0.7 - 4.0 K/uL   Monocytes Relative 3 %   Monocytes Absolute 0.2 0.1 - 1.0 K/uL   Eosinophils Relative 0 %   Eosinophils Absolute 0.0 0.0 - 0.5 K/uL   Basophils Relative 0 %   Basophils Absolute 0.0 0.0 - 0.1 K/uL   Immature Granulocytes 0 %   Abs Immature Granulocytes 0.02 0.00 - 0.07 K/uL  Basic metabolic panel  Result Value Ref Range   Sodium 141 135 - 145 mmol/L   Potassium 3.3 (L) 3.5 - 5.1 mmol/L   Chloride 113 (H) 98 - 111 mmol/L   CO2 18 (L) 22 - 32 mmol/L   Glucose, Bld 111 (H) 70 - 99 mg/dL   BUN 14 8 - 23 mg/dL   Creatinine, Ser 4.250.52 0.44 - 1.00 mg/dL   Calcium 8.3 (L) 8.9 - 10.3 mg/dL   GFR, Estimated >95>60 >63>60 mL/min   Anion gap 10 5 - 15  Urinalysis, Routine w reflex microscopic Urine, Clean Catch  Result Value Ref Range   Color, Urine YELLOW YELLOW   APPearance CLEAR CLEAR   Specific Gravity, Urine 1.017 1.005 - 1.030   pH 7.0 5.0 - 8.0   Glucose, UA  NEGATIVE NEGATIVE mg/dL   Hgb urine dipstick NEGATIVE NEGATIVE  Bilirubin Urine NEGATIVE NEGATIVE   Ketones, ur 80 (A) NEGATIVE mg/dL   Protein, ur 30 (A) NEGATIVE mg/dL   Nitrite NEGATIVE NEGATIVE   Leukocytes,Ua NEGATIVE NEGATIVE   RBC / HPF 0-5 0 - 5 RBC/hpf   WBC, UA 0-5 0 - 5 WBC/hpf   Bacteria, UA NONE SEEN NONE SEEN   Squamous Epithelial / LPF 0-5 0 - 5   Mucus PRESENT   Troponin I (High Sensitivity)  Result Value Ref Range   Troponin I (High Sensitivity) 3 <18 ng/L   CT Head Wo Contrast  Result Date: 01/05/2022 CLINICAL DATA:  Dizziness and vomiting. Patient states that he was washing dishes this morning and turned her head began to have dizziness and vomiting. EXAM: CT HEAD WITHOUT CONTRAST TECHNIQUE: Contiguous axial images were obtained from the base of the skull through the vertex without intravenous contrast. RADIATION DOSE REDUCTION: This exam was performed according to the departmental dose-optimization program which includes automated exposure control, adjustment of the mA and/or kV according to patient size and/or use of iterative reconstruction technique. COMPARISON:  None Available. FINDINGS: Brain: No evidence of acute infarction, hemorrhage, hydrocephalus, extra-axial collection or mass lesion/mass effect. Vascular: No hyperdense vessel or unexpected calcification. Skull: Normal. Negative for fracture or focal lesion. Sinuses/Orbits: No acute finding. Other: None. IMPRESSION: No acute intracranial pathology. Electronically Signed   By: Larose Hires D.O.   On: 01/05/2022 23:28    EKG EKG Interpretation  Date/Time:  Wednesday January 05 2022 23:00:34 EST Ventricular Rate:  75 PR Interval:  153 QRS Duration: 106 QT Interval:  418 QTC Calculation: 467 R Axis:   -1 Text Interpretation: Sinus rhythm Low voltage, extremity leads Confirmed by Shonya Sumida ([id number]) on 01/06/2022 12:27:18 AM  Radiology CT Head Wo Contrast  Result Date: 01/05/2022 CLINICAL DATA:   Dizziness and vomiting. Patient states that he was washing dishes this morning and turned her head began to have dizziness and vomiting. EXAM: CT HEAD WITHOUT CONTRAST TECHNIQUE: Contiguous axial images were obtained from the base of the skull through the vertex without intravenous contrast. RADIATION DOSE REDUCTION: This exam was performed according to the departmental dose-optimization program which includes automated exposure control, adjustment of the mA and/or kV according to patient size and/or use of iterative reconstruction technique. COMPARISON:  None Available. FINDINGS: Brain: No evidence of acute infarction, hemorrhage, hydrocephalus, extra-axial collection or mass lesion/mass effect. Vascular: No hyperdense vessel or unexpected calcification. Skull: Normal. Negative for fracture or focal lesion. Sinuses/Orbits: No acute finding. Other: None. IMPRESSION: No acute intracranial pathology. Electronically Signed   By: Larose Hires D.O.   On: 01/05/2022 23:28    Procedures Procedures    Medications Ordered in ED Medications  sodium chloride 0.9 % bolus 500 mL (has no administration in time range)  meclizine (ANTIVERT) tablet 25 mg (25 mg Oral Given 01/06/22 0028)    ED Course/ Medical Decision Making/ A&P                           Medical Decision Making Patient with sudden onset intense spinning with head movement with associated nausea and vomiting   Amount and/or Complexity of Data Reviewed Independent Historian:     Details: Family members see above  External Data Reviewed: notes.    Details: Previous notes reviewed  Labs: ordered.    Details: All labs reviewed: urine is not consistent with UTI.  COVID flu and RSV negative. Normal white count 6.6, normal hemoglobin 12.1,  platelets slightly low 144k.  Normal troponin.  Normal sodium 141, potassium slight low 3.3, normal creatinine .52 Radiology: ordered and independent interpretation performed.    Details: Normal head CT by me   ECG/medicine tests: ordered and independent interpretation performed. Decision-making details documented in ED Course.  Risk Prescription drug management. Risk Details: Symptoms are consistent with peripheral vertigo. Symptoms are with head movement and are intense and associated with nausea and emesis.  No focal deficits.  I do not believe this is a stroke.  Patient's symptoms resolved with meclizine consistent with peripheral vertigo.  Stable for discharge.  Strict return.      Final Clinical Impression(s) / ED Diagnoses Final diagnoses:  Vertigo   Return for intractable cough, coughing up blood, fevers > 100.4 unrelieved by medication, shortness of breath, intractable vomiting, chest pain, shortness of breath, weakness, numbness, changes in speech, facial asymmetry, abdominal pain, passing out, Inability to tolerate liquids or food, cough, altered mental status or any concerns. No signs of systemic illness or infection. The patient is nontoxic-appearing on exam and vital signs are within normal limits.  I have reviewed the triage vital signs and the nursing notes. Pertinent labs & imaging results that were available during my care of the patient were reviewed by me and considered in my medical decision making (see chart for details). After history, exam, and medical workup I feel the patient has been appropriately medically screened and is safe for discharge home. Pertinent diagnoses were discussed with the patient. Patient was given return precautions.  Rx / DC Orders ED Discharge Orders          Ordered    meclizine (ANTIVERT) 25 MG tablet  3 times daily        01/06/22 0233              Mehar Kirkwood, MD 01/06/22 [id number]

## 2022-10-21 ENCOUNTER — Other Ambulatory Visit: Payer: Self-pay | Admitting: Adult Health

## 2022-10-21 ENCOUNTER — Ambulatory Visit
Admission: RE | Admit: 2022-10-21 | Discharge: 2022-10-21 | Disposition: A | Discharge status: Home / Self Care | Payer: Medicare HMO | Source: Ambulatory Visit | Attending: Adult Health | Admitting: Adult Health

## 2022-10-21 DIAGNOSIS — M79671 Pain in right foot: Secondary | ICD-10-CM

## 2022-11-23 ENCOUNTER — Ambulatory Visit (INDEPENDENT_AMBULATORY_CARE_PROVIDER_SITE_OTHER): Payer: Medicare HMO

## 2022-11-23 ENCOUNTER — Ambulatory Visit: Payer: Medicare HMO | Admitting: Podiatry

## 2022-11-23 DIAGNOSIS — M7751 Other enthesopathy of right foot: Secondary | ICD-10-CM | POA: Diagnosis not present

## 2022-11-23 NOTE — Progress Notes (Signed)
   Chief Complaint  Patient presents with   Foot Pain    Right foot 2 months now, pain is at the bunion area, have tried several different shoes     HPI: 72 y.o. female presenting today as a new patient for evaluation of right great toe pain ongoing for about 2 months now.  It has improved over the past 2 months.  Pain is exacerbated when she walks barefoot around the house especially on hardwood floors.  She says that when she walks barefoot she has increased pain to the right foot  Patient also states that she has tried different shoes to help alleviate the pain and over the past month she began to have some numbness and burning sensation to the toes bilaterally/symmetrically.  This occurred at the same time that she purchased her most recent pair of shoes that she wears daily  Past Medical History:  Diagnosis Date   Palpitations     Past Surgical History:  Procedure Laterality Date   CESAREAN SECTION     TUBAL LIGATION      No Known Allergies   Physical Exam: General: The patient is alert and oriented x3 in no acute distress.  Dermatology: Skin is warm, dry and supple bilateral lower extremities.   Vascular: Palpable pedal pulses bilaterally. Capillary refill within normal limits.  No appreciable edema.  No erythema.  Neurological: Grossly intact via light touch  Musculoskeletal Exam: No pedal deformities noted.  There is some tenderness with palpation of the first MTP of the right foot.  DG Foot Complete Right 10/21/2022 FINDINGS: There is no evidence of fracture or dislocation. There is no evidence of arthropathy or other focal bone abnormality. Soft tissues are unremarkable. There is a posterior calcaneal spur. IMPRESSION: Negative.  Assessment/Plan of Care: 1.  First MTP capsulitis right/sesamoiditis 2.  Metatarsalgia bilateral  -Patient evaluated -X-rays taken 10/21/2022 reviewed. -Advised against going barefoot.  Patient experiences most of her pain and  tenderness when she walks barefoot around the house or in sandals -Recommend good supportive shoes and sneakers.  The patient began to notice burning sensation to the toes at the same time that she purchased her new pair of tennis shoes.  Recommend fabric type shoes that are very accommodative and breathable -Return to clinic as needed       Felecia Shelling, DPM [facility]  Dr. Felecia Shelling, DPM    [address] [place], [place] [address]                Office [phone]  Fax [phone]

## 2023-05-17 ENCOUNTER — Other Ambulatory Visit: Payer: Self-pay | Admitting: Adult Health

## 2023-05-17 DIAGNOSIS — Z780 Asymptomatic menopausal state: Secondary | ICD-10-CM

## 2023-05-30 ENCOUNTER — Other Ambulatory Visit: Payer: Self-pay | Admitting: Family Medicine

## 2023-05-30 DIAGNOSIS — Z1231 Encounter for screening mammogram for malignant neoplasm of breast: Secondary | ICD-10-CM

## 2023-05-30 IMAGING — CR DG SHOULDER 2+V*L*
3 series · 3 of 3 positions shown · non-contrast
Comparison: None.

CLINICAL DATA: Pain superior left humerus after fall 4 weeks ago.

EXAM:
LEFT SHOULDER - 2+ VIEW

[w shoulder grashey left]
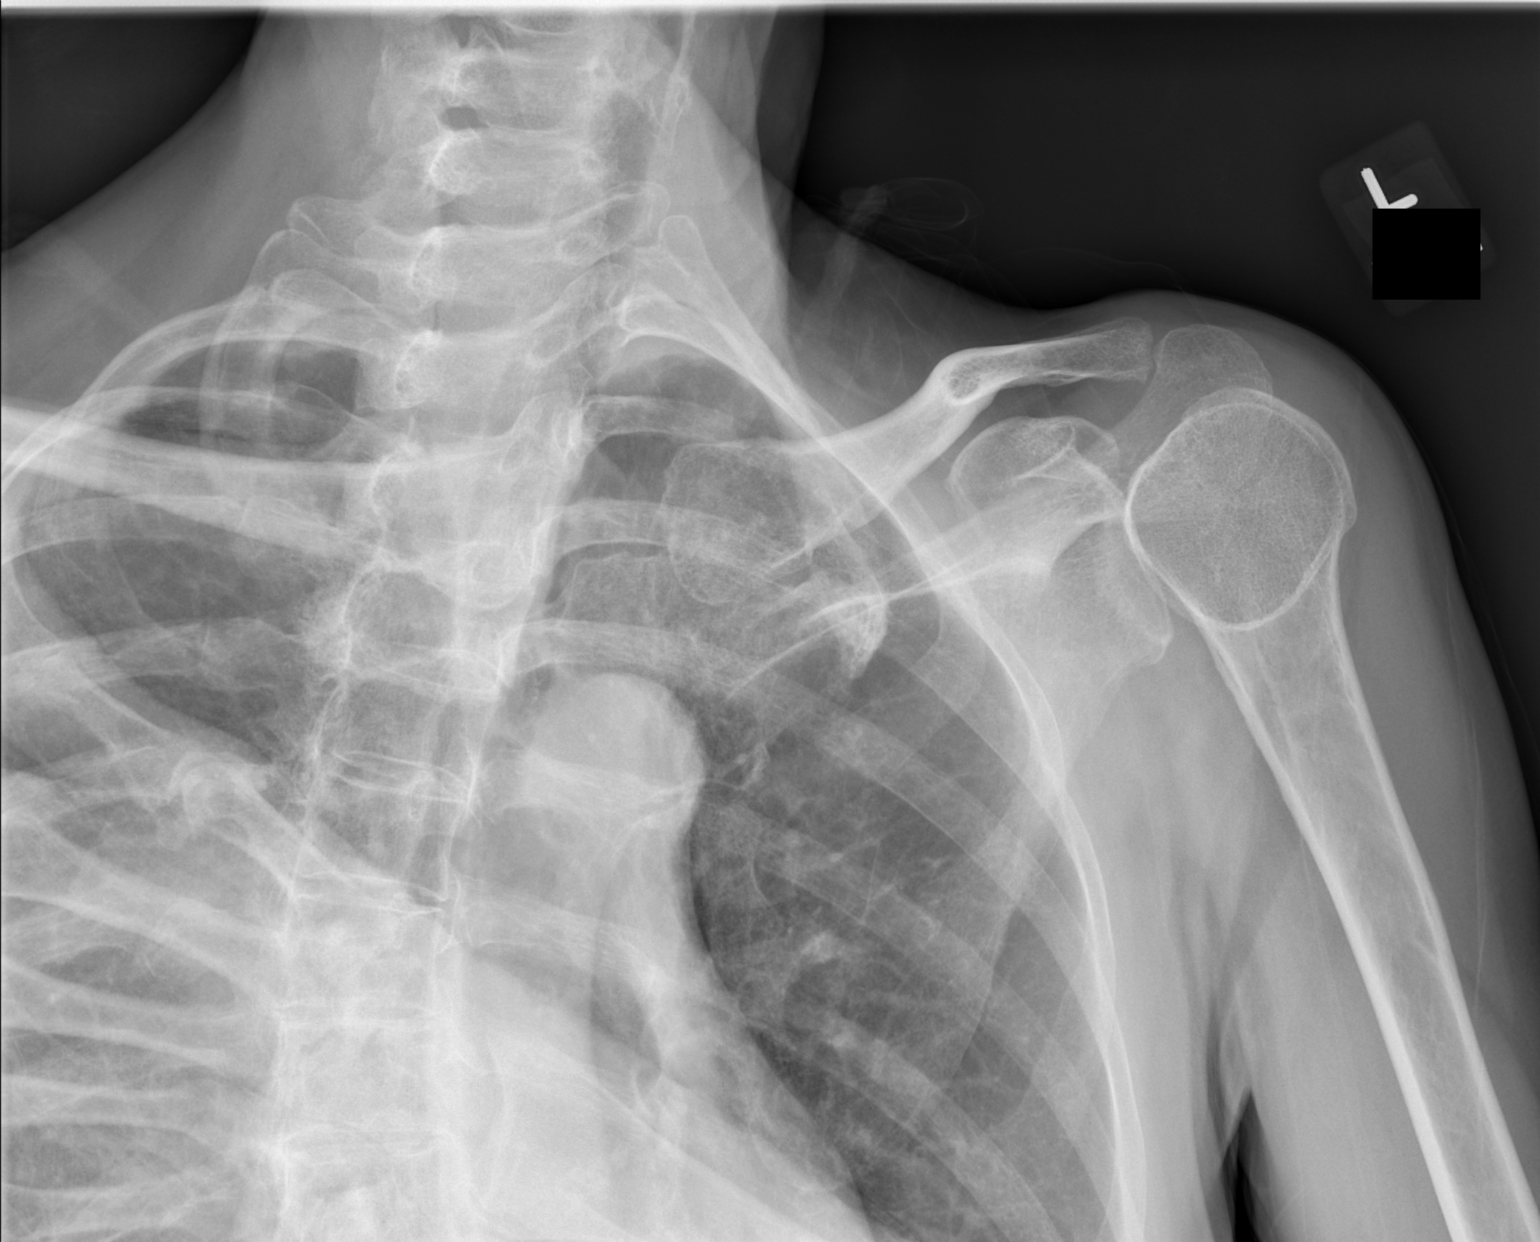

[w shoulder y-view left]
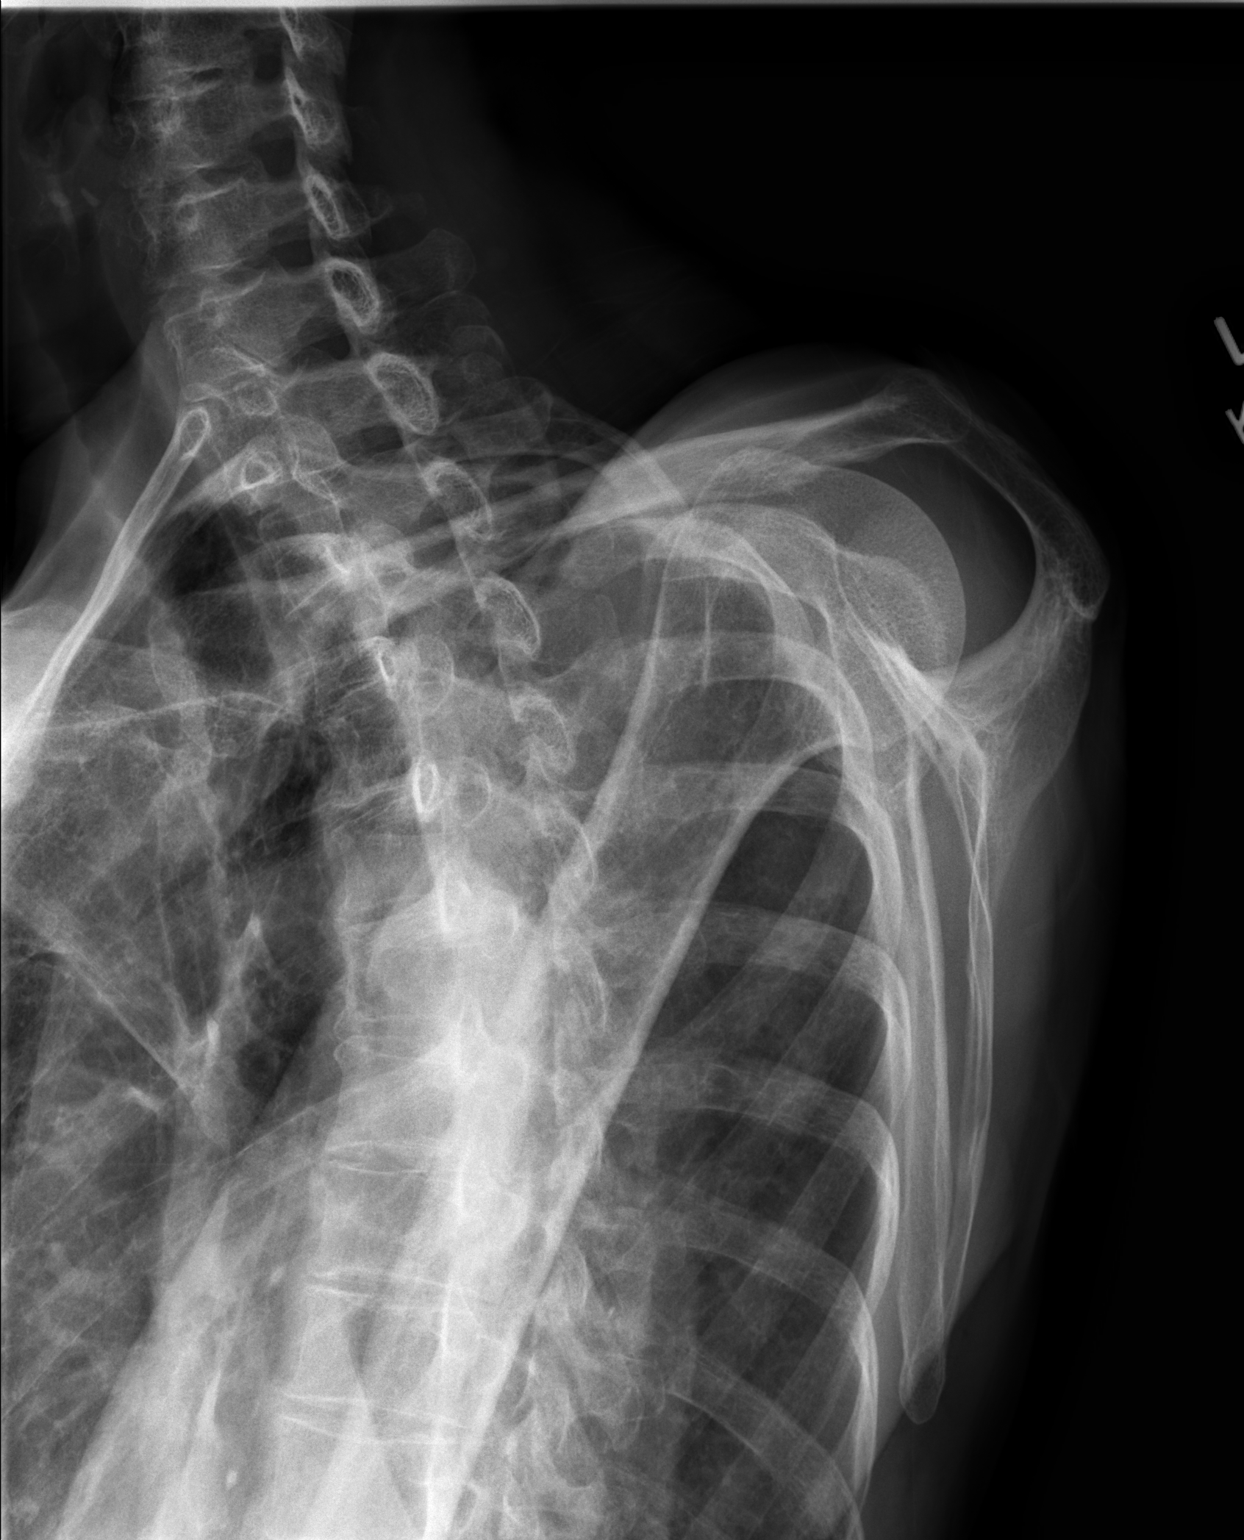

[x shoulder axillary left]
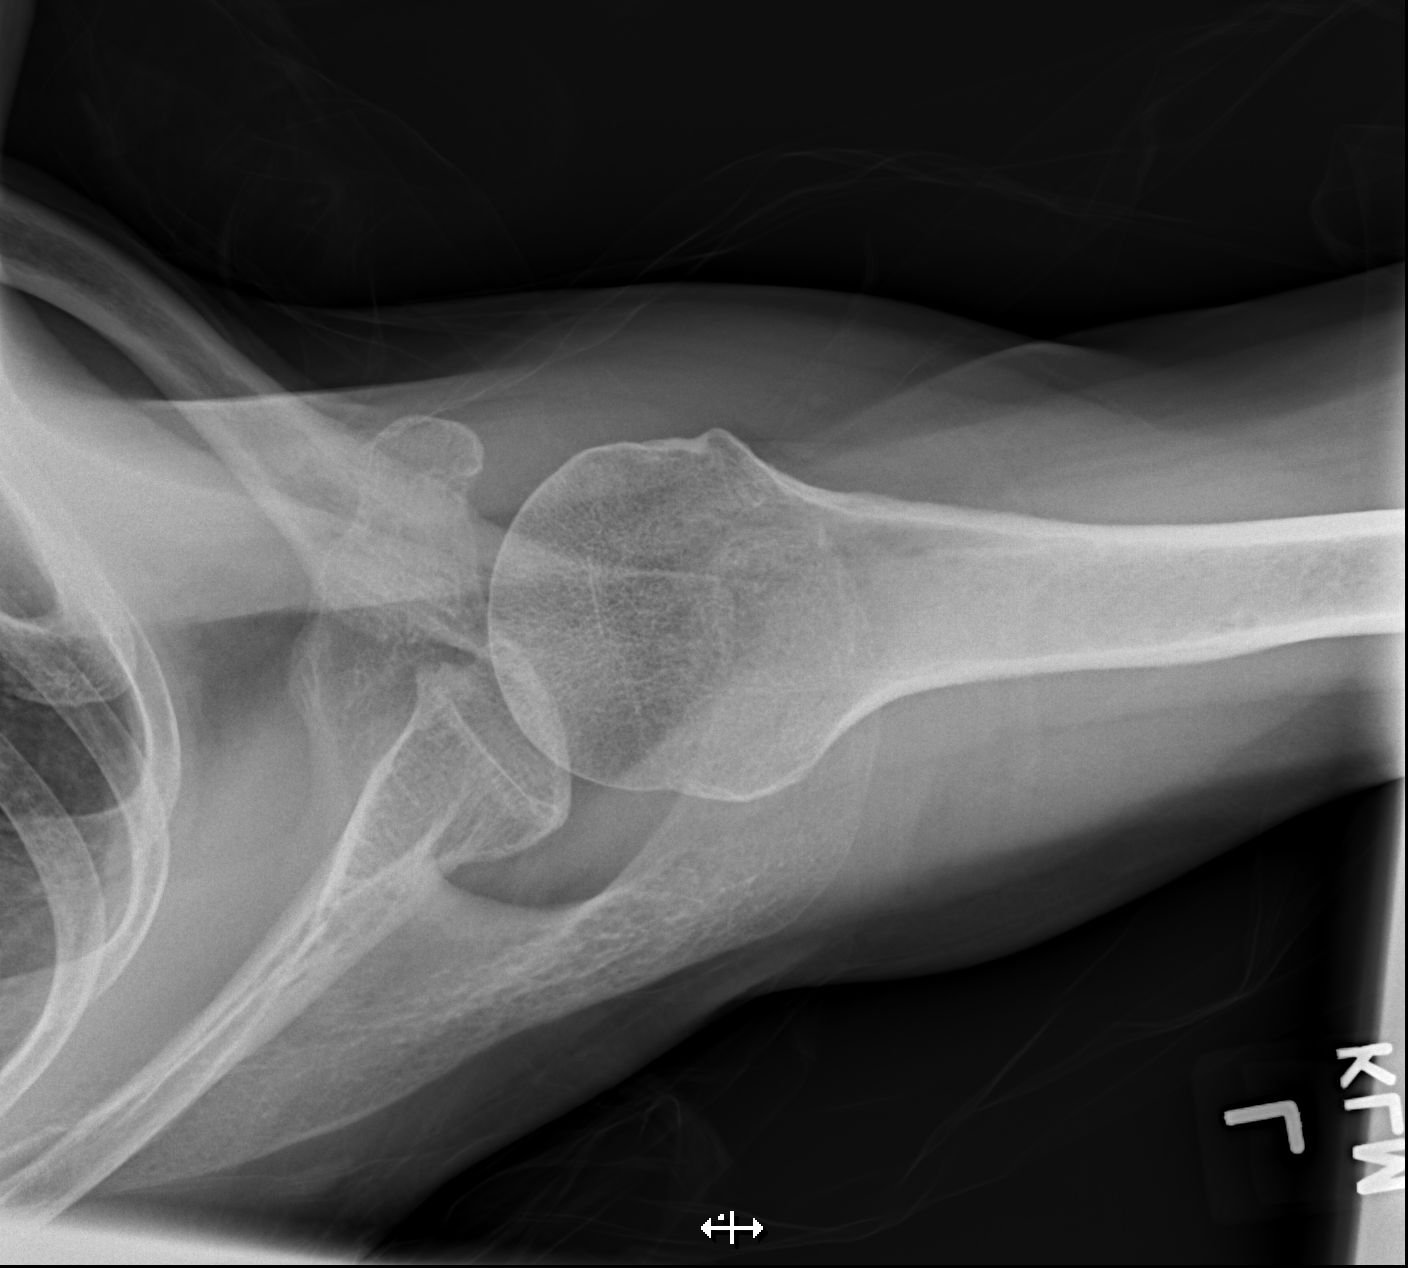

[3 of 3 positions shown; findings below may reference images not displayed]

FINDINGS: There is no evidence of fracture or dislocation. There is no
evidence of arthropathy or other focal bone abnormality. Soft
tissues are unremarkable.
IMPRESSION: No acute osseous abnormality visualized.

## 2023-06-14 ENCOUNTER — Ambulatory Visit
Admission: RE | Admit: 2023-06-14 | Discharge: 2023-06-14 | Disposition: A | Discharge status: Home / Self Care | Source: Ambulatory Visit | Attending: Family Medicine | Admitting: Family Medicine

## 2023-06-14 DIAGNOSIS — Z1231 Encounter for screening mammogram for malignant neoplasm of breast: Secondary | ICD-10-CM
